# Patient Record
Sex: Male | Born: 1995 | Race: White | Hispanic: No | Marital: Single | Smoking: Current every day smoker
Health system: Southern US, Community
[De-identification: ages and names within clinical notes are randomized; demographics above are authoritative.]

## PROBLEM LIST (undated history)

## (undated) DIAGNOSIS — F32A Depression, unspecified: Secondary | ICD-10-CM

## (undated) DIAGNOSIS — F419 Anxiety disorder, unspecified: Secondary | ICD-10-CM

## (undated) DIAGNOSIS — F329 Major depressive disorder, single episode, unspecified: Secondary | ICD-10-CM

---
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
## 2015-01-16 ENCOUNTER — Observation Stay
Admission: EM | Admit: 2015-01-16 | Discharge: 2015-01-17 | Disposition: A | Discharge status: Home / Self Care | Payer: Managed Care, Other (non HMO) | Attending: Internal Medicine | Admitting: Internal Medicine

## 2015-01-16 ENCOUNTER — Emergency Department: Payer: Managed Care, Other (non HMO)

## 2015-01-16 ENCOUNTER — Other Ambulatory Visit: Payer: Self-pay

## 2015-01-16 DIAGNOSIS — I499 Cardiac arrhythmia, unspecified: Secondary | ICD-10-CM

## 2015-01-16 DIAGNOSIS — F329 Major depressive disorder, single episode, unspecified: Secondary | ICD-10-CM | POA: Insufficient documentation

## 2015-01-16 DIAGNOSIS — T44905S Adverse effect of unspecified drugs primarily affecting the autonomic nervous system, sequela: Secondary | ICD-10-CM | POA: Diagnosis present

## 2015-01-16 DIAGNOSIS — R55 Syncope and collapse: Secondary | ICD-10-CM | POA: Diagnosis not present

## 2015-01-16 DIAGNOSIS — Z79899 Other long term (current) drug therapy: Secondary | ICD-10-CM | POA: Diagnosis not present

## 2015-01-16 DIAGNOSIS — F17210 Nicotine dependence, cigarettes, uncomplicated: Secondary | ICD-10-CM | POA: Insufficient documentation

## 2015-01-16 DIAGNOSIS — F419 Anxiety disorder, unspecified: Secondary | ICD-10-CM | POA: Diagnosis not present

## 2015-01-16 DIAGNOSIS — R569 Unspecified convulsions: Principal | ICD-10-CM

## 2015-01-16 HISTORY — DX: Anxiety disorder, unspecified: F41.9

## 2015-01-16 HISTORY — DX: Depression, unspecified: F32.A

## 2015-01-16 HISTORY — DX: Major depressive disorder, single episode, unspecified: F32.9

## 2015-01-16 LAB — URINE DRUG SCREEN, QUALITATIVE (ARMC ONLY)
Amphetamines, Ur Screen: NOT DETECTED
BENZODIAZEPINE, UR SCRN: POSITIVE — AB
Barbiturates, Ur Screen: NOT DETECTED
Cannabinoid 50 Ng, Ur ~~LOC~~: NOT DETECTED
Cocaine Metabolite,Ur ~~LOC~~: NOT DETECTED
MDMA (Ecstasy)Ur Screen: NOT DETECTED
Methadone Scn, Ur: NOT DETECTED
OPIATE, UR SCREEN: NOT DETECTED
Phencyclidine (PCP) Ur S: NOT DETECTED
TRICYCLIC, UR SCREEN: NOT DETECTED

## 2015-01-16 LAB — CBC
HCT: 45.2 % (ref 40.0–52.0)
HEMOGLOBIN: 15.2 g/dL (ref 13.0–18.0)
MCH: 30.8 pg (ref 26.0–34.0)
MCHC: 33.5 g/dL (ref 32.0–36.0)
MCV: 91.8 fL (ref 80.0–100.0)
Platelets: 246 10*3/uL (ref 150–440)
RBC: 4.93 MIL/uL (ref 4.40–5.90)
RDW: 14.1 % (ref 11.5–14.5)
WBC: 10.6 10*3/uL (ref 3.8–10.6)

## 2015-01-16 LAB — BASIC METABOLIC PANEL
Anion gap: 15 (ref 5–15)
BUN: 12 mg/dL (ref 6–20)
CO2: 22 mmol/L (ref 22–32)
Calcium: 9.3 mg/dL (ref 8.9–10.3)
Chloride: 103 mmol/L (ref 101–111)
Creatinine, Ser: 1.15 mg/dL (ref 0.61–1.24)
GFR calc non Af Amer: >60 mL/min (ref >60)
Glucose, Bld: 149 mg/dL — ABNORMAL HIGH (ref 65–99)
POTASSIUM: 3.5 mmol/L (ref 3.5–5.1)
SODIUM: 140 mmol/L (ref 135–145)

## 2015-01-16 LAB — FIBRIN DERIVATIVES D-DIMER (ARMC ONLY): FIBRIN DERIVATIVES D-DIMER (ARMC): 227 (ref 0–499)

## 2015-01-16 LAB — SALICYLATE LEVEL

## 2015-01-16 LAB — CK: CK TOTAL: 175 U/L (ref 49–397)

## 2015-01-16 LAB — ETHANOL: Alcohol, Ethyl (B): <5 mg/dL (ref <5)

## 2015-01-16 LAB — TROPONIN I

## 2015-01-16 LAB — ACETAMINOPHEN LEVEL: Acetaminophen (Tylenol), Serum: <10 ug/mL — ABNORMAL LOW (ref 10–30)

## 2015-01-16 MED ORDER — HYDROCODONE-ACETAMINOPHEN 5-325 MG PO TABS
1.0000 | ORAL_TABLET | ORAL | Status: DC | PRN
  Administered 2015-01-16: 1 via ORAL
  Administered 2015-01-16: 2 via ORAL
  Filled 2015-01-16 (×3): qty 1

## 2015-01-16 MED ORDER — MAGNESIUM SULFATE 2 GM/50ML IV SOLN
2.0000 g | Freq: Once | INTRAVENOUS | Status: AC
  Administered 2015-01-16: 2 g via INTRAVENOUS
  Filled 2015-01-16: qty 50

## 2015-01-16 MED ORDER — ONDANSETRON HCL 4 MG/2ML IJ SOLN
4.0000 mg | Freq: Four times a day (QID) | INTRAMUSCULAR | Status: DC | PRN
  Administered 2015-01-17: 4 mg via INTRAVENOUS
  Filled 2015-01-16: qty 2

## 2015-01-16 MED ORDER — SODIUM CHLORIDE 0.9 % IV BOLUS (SEPSIS)
1000.0000 mL | Freq: Once | INTRAVENOUS | Status: AC
  Administered 2015-01-16: 1000 mL via INTRAVENOUS

## 2015-01-16 MED ORDER — LORAZEPAM 2 MG/ML IJ SOLN
1.0000 mg | Freq: Once | INTRAMUSCULAR | Status: AC
  Administered 2015-01-16: 1 mg via INTRAVENOUS

## 2015-01-16 MED ORDER — POTASSIUM CHLORIDE IN NACL 20-0.9 MEQ/L-% IV SOLN
INTRAVENOUS | Status: AC
  Filled 2015-01-16: qty 1000

## 2015-01-16 MED ORDER — ONDANSETRON HCL 4 MG PO TABS
4.0000 mg | ORAL_TABLET | Freq: Four times a day (QID) | ORAL | Status: DC | PRN
  Filled 2015-01-16: qty 1

## 2015-01-16 MED ORDER — LORAZEPAM 2 MG/ML IJ SOLN
INTRAMUSCULAR | Status: AC
  Filled 2015-01-16: qty 1

## 2015-01-16 MED ORDER — ACETAMINOPHEN 325 MG PO TABS
650.0000 mg | ORAL_TABLET | Freq: Four times a day (QID) | ORAL | Status: DC | PRN
  Administered 2015-01-16 – 2015-01-17 (×2): 650 mg via ORAL
  Filled 2015-01-16 (×2): qty 2

## 2015-01-16 MED ORDER — SODIUM CHLORIDE 0.9 % IJ SOLN
3.0000 mL | Freq: Two times a day (BID) | INTRAMUSCULAR | Status: DC
  Administered 2015-01-17: 3 mL via INTRAVENOUS

## 2015-01-16 MED ORDER — ENOXAPARIN SODIUM 40 MG/0.4ML ~~LOC~~ SOLN
40.0000 mg | SUBCUTANEOUS | Status: DC
Start: 2015-01-16 — End: 2015-01-17
  Administered 2015-01-16: 40 mg via SUBCUTANEOUS
  Filled 2015-01-16: qty 0.4

## 2015-01-16 MED ORDER — CLONAZEPAM 0.5 MG PO TABS: 0.5000 mg | ORAL_TABLET | Freq: Three times a day (TID) | ORAL | Status: DC | PRN

## 2015-01-16 MED ORDER — ACETAMINOPHEN 650 MG RE SUPP: 650.0000 mg | Freq: Four times a day (QID) | RECTAL | Status: DC | PRN

## 2015-01-16 MED ORDER — POTASSIUM CHLORIDE IN NACL 20-0.9 MEQ/L-% IV SOLN
INTRAVENOUS | Status: DC
  Administered 2015-01-16 – 2015-01-17 (×3): via INTRAVENOUS
  Filled 2015-01-16 (×5): qty 1000

## 2015-01-16 MED ORDER — LORAZEPAM 2 MG/ML IJ SOLN: 1.0000 mg | INTRAMUSCULAR | Status: DC | PRN
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
## 2015-01-16 NOTE — ED Notes (Signed)
Quale MD at bedside 

## 2015-01-16 NOTE — ED Notes (Signed)
MD at bedside. 

## 2015-01-16 NOTE — ED Notes (Signed)
Patient resting comfortably. Father at bedside. Will remain with patient until taken to room. Denies any concerns or questions at this time.
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
## 2015-01-16 NOTE — ED Notes (Signed)
Pt unable to urinate at this time. Urinal given to pt and instructed to void when able.
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
## 2015-01-16 NOTE — Progress Notes (Addendum)
Pt. admitted to unit, rm 256. Oriented to room, call bell, Ascom phones and staff. Bed in low position. Fall safety plan reviewed, yellow non skid socks in place, bed alarm on and fall contract reviewed, signed and placed on wall. Seizure precautions initiated. Full assessment to Epic, pertinent care plan opened and education provided on seizures. Will continue to monitor.

## 2015-01-17 ENCOUNTER — Observation Stay (HOSPITAL_BASED_OUTPATIENT_CLINIC_OR_DEPARTMENT_OTHER)
Admit: 2015-01-17 | Discharge: 2015-01-17 | Disposition: A | Discharge status: Home / Self Care | Payer: Managed Care, Other (non HMO) | Attending: Internal Medicine | Admitting: Internal Medicine

## 2015-01-17 ENCOUNTER — Observation Stay: Payer: Managed Care, Other (non HMO)

## 2015-01-17 DIAGNOSIS — R569 Unspecified convulsions: Secondary | ICD-10-CM | POA: Diagnosis not present

## 2015-01-17 DIAGNOSIS — I499 Cardiac arrhythmia, unspecified: Secondary | ICD-10-CM | POA: Diagnosis not present

## 2015-01-17 DIAGNOSIS — R55 Syncope and collapse: Secondary | ICD-10-CM

## 2015-01-17 LAB — COMPREHENSIVE METABOLIC PANEL
ALBUMIN: 3.9 g/dL (ref 3.5–5.0)
ALT: 17 U/L (ref 17–63)
AST: 24 U/L (ref 15–41)
Alkaline Phosphatase: 54 U/L (ref 38–126)
Anion gap: 4 — ABNORMAL LOW (ref 5–15)
BILIRUBIN TOTAL: 1.2 mg/dL (ref 0.3–1.2)
BUN: 7 mg/dL (ref 6–20)
CALCIUM: 8.6 mg/dL — AB (ref 8.9–10.3)
CO2: 29 mmol/L (ref 22–32)
CREATININE: 0.86 mg/dL (ref 0.61–1.24)
Chloride: 107 mmol/L (ref 101–111)
GFR calc Af Amer: >60 mL/min (ref >60)
GFR calc non Af Amer: >60 mL/min (ref >60)
GLUCOSE: 95 mg/dL (ref 65–99)
Potassium: 3.8 mmol/L (ref 3.5–5.1)
SODIUM: 140 mmol/L (ref 135–145)
TOTAL PROTEIN: 5.7 g/dL — AB (ref 6.5–8.1)

## 2015-01-17 LAB — CBC
HCT: 39.7 % — ABNORMAL LOW (ref 40.0–52.0)
Hemoglobin: 13.4 g/dL (ref 13.0–18.0)
MCH: 30.8 pg (ref 26.0–34.0)
MCHC: 33.8 g/dL (ref 32.0–36.0)
MCV: 91.3 fL (ref 80.0–100.0)
Platelets: 220 10*3/uL (ref 150–440)
RBC: 4.35 MIL/uL — ABNORMAL LOW (ref 4.40–5.90)
RDW: 14.2 % (ref 11.5–14.5)
WBC: 10.7 10*3/uL — AB (ref 3.8–10.6)

## 2015-01-17 MED ORDER — GADOBENATE DIMEGLUMINE 529 MG/ML IV SOLN
15.0000 mL | Freq: Once | INTRAVENOUS | Status: AC | PRN
  Administered 2015-01-17: 13 mL via INTRAVENOUS
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
## 2015-01-17 NOTE — Progress Notes (Signed)
Pt. Given discharge instructions verbalizes understanding with teach back. IV access removed telemetry box removed. Discharge with father.

## 2015-01-17 NOTE — Progress Notes (Signed)
*  PRELIMINARY RESULTS* Echocardiogram 2D Echocardiogram has been performed.  Georgann HousekeeperJerry R Hege 01/17/2015, 12:28 PM

## 2015-01-17 NOTE — Progress Notes (Signed)
Nurse notified of weight difference
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
## 2017-01-03 ENCOUNTER — Emergency Department
Admission: EM | Admit: 2017-01-03 | Discharge: 2017-01-03 | Disposition: A | Discharge status: Home / Self Care | Payer: Commercial Managed Care - PPO | Attending: Emergency Medicine | Admitting: Emergency Medicine

## 2017-01-03 ENCOUNTER — Emergency Department: Payer: Commercial Managed Care - PPO

## 2017-01-03 ENCOUNTER — Encounter: Payer: Self-pay | Admitting: Emergency Medicine

## 2017-01-03 DIAGNOSIS — Y929 Unspecified place or not applicable: Secondary | ICD-10-CM | POA: Insufficient documentation

## 2017-01-03 DIAGNOSIS — F17200 Nicotine dependence, unspecified, uncomplicated: Secondary | ICD-10-CM | POA: Diagnosis not present

## 2017-01-03 DIAGNOSIS — R51 Headache: Secondary | ICD-10-CM | POA: Insufficient documentation

## 2017-01-03 DIAGNOSIS — Y939 Activity, unspecified: Secondary | ICD-10-CM | POA: Diagnosis not present

## 2017-01-03 DIAGNOSIS — Z79899 Other long term (current) drug therapy: Secondary | ICD-10-CM | POA: Diagnosis not present

## 2017-01-03 DIAGNOSIS — R0789 Other chest pain: Secondary | ICD-10-CM | POA: Insufficient documentation

## 2017-01-03 DIAGNOSIS — Y999 Unspecified external cause status: Secondary | ICD-10-CM | POA: Diagnosis not present

## 2017-01-03 MED ORDER — KETOROLAC TROMETHAMINE 30 MG/ML IJ SOLN
30.0000 mg | Freq: Once | INTRAMUSCULAR | Status: AC
  Administered 2017-01-03: 30 mg via INTRAMUSCULAR
  Filled 2017-01-03: qty 1

## 2017-01-03 MED ORDER — CYCLOBENZAPRINE HCL 5 MG PO TABS: 5.0000 mg | ORAL_TABLET | Freq: Three times a day (TID) | ORAL | 0 refills | Status: AC | PRN

## 2017-01-03 MED ORDER — MELOXICAM 7.5 MG PO TABS: 7.5000 mg | ORAL_TABLET | Freq: Every day | ORAL | 1 refills | Status: AC
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
## 2017-06-12 ENCOUNTER — Emergency Department
Admission: EM | Admit: 2017-06-12 | Discharge: 2017-06-12 | Discharge status: Against Medical Advice | Payer: No Typology Code available for payment source

## 2017-06-12 NOTE — ED Notes (Signed)
Pt called twice for triage with no answer 

## 2017-06-26 ENCOUNTER — Emergency Department: Payer: Commercial Managed Care - PPO

## 2017-06-26 ENCOUNTER — Emergency Department
Admission: EM | Admit: 2017-06-26 | Discharge: 2017-06-26 | Discharge status: Against Medical Advice | Payer: Commercial Managed Care - PPO | Attending: Emergency Medicine | Admitting: Emergency Medicine

## 2017-06-26 ENCOUNTER — Encounter: Payer: Self-pay | Admitting: Emergency Medicine

## 2017-06-26 ENCOUNTER — Other Ambulatory Visit: Payer: Self-pay

## 2017-06-26 DIAGNOSIS — J069 Acute upper respiratory infection, unspecified: Secondary | ICD-10-CM | POA: Insufficient documentation

## 2017-06-26 DIAGNOSIS — R0789 Other chest pain: Secondary | ICD-10-CM | POA: Insufficient documentation

## 2017-06-26 DIAGNOSIS — Z79899 Other long term (current) drug therapy: Secondary | ICD-10-CM | POA: Insufficient documentation

## 2017-06-26 DIAGNOSIS — F17210 Nicotine dependence, cigarettes, uncomplicated: Secondary | ICD-10-CM | POA: Diagnosis not present

## 2017-06-26 DIAGNOSIS — R05 Cough: Secondary | ICD-10-CM | POA: Diagnosis present

## 2017-06-26 LAB — CBC WITH DIFFERENTIAL/PLATELET
BASOS PCT: 1 %
Basophils Absolute: 0.1 10*3/uL (ref 0–0.1)
Eosinophils Absolute: 0.6 10*3/uL (ref 0–0.7)
Eosinophils Relative: 6 %
HEMATOCRIT: 42.5 % (ref 40.0–52.0)
Hemoglobin: 14.5 g/dL (ref 13.0–18.0)
LYMPHS PCT: 19 %
Lymphs Abs: 1.9 10*3/uL (ref 1.0–3.6)
MCH: 31.1 pg (ref 26.0–34.0)
MCHC: 34.1 g/dL (ref 32.0–36.0)
MCV: 91.1 fL (ref 80.0–100.0)
MONO ABS: 1.6 10*3/uL — AB (ref 0.2–1.0)
MONOS PCT: 16 %
NEUTROS ABS: 6 10*3/uL (ref 1.4–6.5)
Neutrophils Relative %: 58 %
Platelets: 238 10*3/uL (ref 150–440)
RBC: 4.66 MIL/uL (ref 4.40–5.90)
RDW: 12.7 % (ref 11.5–14.5)
WBC: 10.3 10*3/uL (ref 3.8–10.6)

## 2017-06-26 LAB — BASIC METABOLIC PANEL
Anion gap: 8 (ref 5–15)
BUN: 16 mg/dL (ref 6–20)
CALCIUM: 8.8 mg/dL — AB (ref 8.9–10.3)
CO2: 28 mmol/L (ref 22–32)
CREATININE: 0.9 mg/dL (ref 0.61–1.24)
Chloride: 101 mmol/L (ref 101–111)
GFR calc Af Amer: >60 mL/min (ref >60)
GFR calc non Af Amer: >60 mL/min (ref >60)
GLUCOSE: 92 mg/dL (ref 65–99)
Potassium: 3.3 mmol/L — ABNORMAL LOW (ref 3.5–5.1)
Sodium: 137 mmol/L (ref 135–145)

## 2017-06-26 LAB — TROPONIN I
Troponin I: <0.03 ng/mL (ref <0.03)
Troponin I: <0.03 ng/mL (ref <0.03)

## 2017-06-26 LAB — INFLUENZA PANEL BY PCR (TYPE A & B)
Influenza A By PCR: NEGATIVE
Influenza B By PCR: NEGATIVE

## 2017-06-26 MED ORDER — ACETAMINOPHEN 325 MG PO TABS
650.0000 mg | ORAL_TABLET | Freq: Once | ORAL | Status: DC
  Filled 2017-06-26: qty 2

## 2017-06-26 MED ORDER — KETOROLAC TROMETHAMINE 60 MG/2ML IM SOLN
60.0000 mg | Freq: Once | INTRAMUSCULAR | Status: AC
  Administered 2017-06-26: 60 mg via INTRAMUSCULAR
  Filled 2017-06-26: qty 2
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
## 2017-09-27 ENCOUNTER — Emergency Department
Admission: EM | Admit: 2017-09-27 | Discharge: 2017-09-27 | Disposition: A | Discharge status: Home / Self Care | Payer: Commercial Managed Care - PPO | Attending: Emergency Medicine | Admitting: Emergency Medicine

## 2017-09-27 ENCOUNTER — Other Ambulatory Visit: Payer: Self-pay

## 2017-09-27 ENCOUNTER — Encounter: Payer: Self-pay | Admitting: Emergency Medicine

## 2017-09-27 DIAGNOSIS — F17210 Nicotine dependence, cigarettes, uncomplicated: Secondary | ICD-10-CM | POA: Insufficient documentation

## 2017-09-27 DIAGNOSIS — Z79899 Other long term (current) drug therapy: Secondary | ICD-10-CM | POA: Insufficient documentation

## 2017-09-27 DIAGNOSIS — J029 Acute pharyngitis, unspecified: Secondary | ICD-10-CM | POA: Diagnosis present

## 2017-09-27 MED ORDER — MELOXICAM 7.5 MG PO TABS
ORAL_TABLET | ORAL | Status: AC
  Filled 2017-09-27: qty 1

## 2017-09-27 MED ORDER — MELOXICAM 15 MG PO TABS: 15.0000 mg | ORAL_TABLET | Freq: Every day | ORAL | 1 refills | Status: AC

## 2017-09-27 MED ORDER — MELOXICAM 7.5 MG PO TABS
15.0000 mg | ORAL_TABLET | Freq: Every day | ORAL | Status: DC
  Administered 2017-09-27: 15 mg via ORAL

## 2017-09-27 NOTE — ED Notes (Signed)
Pt states that he "doesn'r really want anything for his throat"..the patient just "wants my DC papers for my lawyer".

## 2017-09-27 NOTE — ED Triage Notes (Signed)
Pt arrives from restaurant where he states that he reports that he was eating banana pudding and "felt something sharp go down his throat". Pt reports that he swallowed some blood after he "hocked up" a skewer. Pt reports that the pain in his throat is getting worse and that his "lawyer told me to get my DC papers". Pt is in NAD.

## 2017-09-27 NOTE — ED Provider Notes (Signed)
[facility] Emergency Department Provider Note  ____________________________________________  Time seen: Approximately 11:08 PM  I have reviewed the triage vital signs and the nursing notes.   HISTORY  Chief Complaint Sore Throat    HPI Theodore Ward is a 22 y.o. male presents to the emergency department after patient reports that he choked on a 1 inch wooden skewer while eating at [employer].  Patient reports that he was enjoying his anniversary with his significant other, when he encountered wooden skewer and banana pudding.  Patient reports that he successfully coughed up a wooden skewer.  Patient reports that he was spitting blood for several minutes after the choking episode.  Patient reports that bleeding resolved and has since stopped.  Patient reports that he is experiencing pharyngitis but no difficulty managing his own secretions.  He is speaking in complete sentences.  Patient reports that he was told to come to the emergency department by his attorney as he intends to take legal action against [employer] because they refunded $26 instead of $36.   Past Medical History:  Diagnosis Date  . Anxiety   . Depression     Patient Active Problem List   Diagnosis Date Noted  . Seizure (HCC) 01/16/2015  . Cardiac arrhythmia 01/16/2015  . Arrhythmia     History reviewed. No pertinent surgical history.  Prior to Admission medications   Medication Sig Start Date End Date Taking? Authorizing Provider  clonazePAM (KLONOPIN) 0.5 MG tablet Take 0.5 mg by mouth 3 (three) times daily as needed for anxiety.    [provider]  escitalopram (LEXAPRO) 10 MG tablet Take 10 mg by mouth daily. 01/12/15   [provider]  meloxicam (MOBIC) 15 MG tablet Take 1 tablet (15 mg total) by mouth daily for 7 days. 09/27/17 10/04/17  Orvil Feil, PA-C    Allergies Patient has no known allergies.  Family History  Problem Relation Age of Onset  .  Migraines Mother   . Seizures Maternal Aunt     Social History Social History   Tobacco Use  . Smoking status: Current Every Day Smoker    Types: E-cigarettes  . Smokeless tobacco: Never Used  Substance Use Topics  . Alcohol use: Yes    Alcohol/week: 0.0 oz    Comment: Social   . Drug use: Yes    Types: Marijuana     Review of Systems  Constitutional: No fever/chills Eyes: No visual changes. No discharge ENT: Patient has pharyngitis Cardiovascular: no chest pain. Respiratory: no cough. No SOB. Gastrointestinal: No abdominal pain.  No nausea, no vomiting.  No diarrhea.  No constipation. Musculoskeletal: Negative for musculoskeletal pain. Skin: Negative for rash, abrasions, lacerations, ecchymosis. Neurological: Negative for headaches, focal weakness or numbness.   ____________________________________________   PHYSICAL EXAM:  VITAL SIGNS: ED Triage Vitals  Enc Vitals Group     BP 09/27/17 2207 (!) 133/105     Pulse --      Resp 09/27/17 2207 18     Temp 09/27/17 2207 98.6 F (37 C)     Temp Source 09/27/17 2207 Oral     SpO2 09/27/17 2207 100 %     Weight 09/27/17 2214 160 lb (72.6 kg)     Height 09/27/17 2214 5\' 9"  (1.753 m)     Head Circumference --      Peak Flow --      Pain Score 09/27/17 2214 8     Pain Loc --  Pain Edu? --      Excl. in GC? --      Constitutional: Alert and oriented. Well appearing and in no acute distress. Eyes: Conjunctivae are normal. PERRL. EOMI. Head: Atraumatic. ENT:      Ears: TMs are pearly.      Nose: No congestion/rhinnorhea.      Mouth/Throat: Mucous membranes are moist.  No abrasions are visualized along the posterior pharynx. Neck: No stridor. No cervical spine tenderness to palpation.  Cardiovascular: Normal rate, regular rhythm. Normal S1 and S2.  Good peripheral circulation. Respiratory: Normal respiratory effort without tachypnea or retractions. Lungs CTAB. Good air entry to the bases with no decreased or  absent breath sounds. Neurologic:  Normal speech and language.   Skin:  Skin is warm, dry and intact.    ____________________________________________   LABS (all labs ordered are listed, but only abnormal results are displayed)  Labs Reviewed - No data to display ____________________________________________  EKG   ____________________________________________  RADIOLOGY   No results found.  ____________________________________________    PROCEDURES  Procedure(s) performed:    Procedures    Medications  meloxicam (MOBIC) tablet 15 mg (has no administration in time range)     ____________________________________________   INITIAL IMPRESSION / ASSESSMENT AND PLAN / ED COURSE  Pertinent labs & imaging results that were available during my care of the patient were reviewed by me and considered in my medical decision making (see chart for details).  Review of the [place] CSRS was performed in accordance of the [facility] prior to dispensing any controlled drugs.     Assessment and plan Pharyngitis Patient presents to the emergency department with pharyngitis after patient choked on a wooden skewer at [employer].  Overall physical exam was reassuring.  Patient reports that he is no longer spitting up blood.  No acute intervention is needed at this time.  Strict return precautions were given to return to the emergency department for new or worsening symptoms.  Patient voiced understanding.  Patient was discharged with meloxicam for pain.  All patient questions were answered.   ____________________________________________  FINAL CLINICAL IMPRESSION(S) / ED DIAGNOSES  Final diagnoses:  Pharyngitis, unspecified etiology      NEW MEDICATIONS STARTED DURING THIS VISIT:  ED Discharge Orders        Ordered    meloxicam (MOBIC) 15 MG tablet  Daily     09/27/17 2300          This chart was dictated using voice recognition software/Dragon. Despite best efforts to  proofread, errors can occur which can change the meaning. Any change was purely unintentional.    Orvil FeilWoods, Jaclyn M, PA-C 09/27/17 2313    Don PerkingVeronese, [place], MD 09/28/17 0005

## 2019-01-25 ENCOUNTER — Other Ambulatory Visit: Payer: Self-pay

## 2019-01-25 ENCOUNTER — Encounter: Payer: Self-pay | Admitting: Emergency Medicine

## 2019-01-25 ENCOUNTER — Emergency Department
Admission: EM | Admit: 2019-01-25 | Discharge: 2019-01-25 | Disposition: A | Discharge status: Home / Self Care | Payer: Commercial Managed Care - PPO | Attending: Emergency Medicine | Admitting: Emergency Medicine

## 2019-01-25 DIAGNOSIS — Z79899 Other long term (current) drug therapy: Secondary | ICD-10-CM | POA: Insufficient documentation

## 2019-01-25 DIAGNOSIS — R21 Rash and other nonspecific skin eruption: Secondary | ICD-10-CM | POA: Diagnosis present

## 2019-01-25 DIAGNOSIS — S80862A Insect bite (nonvenomous), left lower leg, initial encounter: Secondary | ICD-10-CM | POA: Insufficient documentation

## 2019-01-25 DIAGNOSIS — Y9289 Other specified places as the place of occurrence of the external cause: Secondary | ICD-10-CM | POA: Diagnosis not present

## 2019-01-25 DIAGNOSIS — F17290 Nicotine dependence, other tobacco product, uncomplicated: Secondary | ICD-10-CM | POA: Diagnosis not present

## 2019-01-25 DIAGNOSIS — Y93H9 Activity, other involving exterior property and land maintenance, building and construction: Secondary | ICD-10-CM | POA: Diagnosis not present

## 2019-01-25 DIAGNOSIS — Y999 Unspecified external cause status: Secondary | ICD-10-CM | POA: Insufficient documentation

## 2019-01-25 DIAGNOSIS — S40861A Insect bite (nonvenomous) of right upper arm, initial encounter: Secondary | ICD-10-CM | POA: Diagnosis not present

## 2019-01-25 DIAGNOSIS — W57XXXA Bitten or stung by nonvenomous insect and other nonvenomous arthropods, initial encounter: Secondary | ICD-10-CM | POA: Diagnosis not present

## 2019-01-25 DIAGNOSIS — S80869A Insect bite (nonvenomous), unspecified lower leg, initial encounter: Secondary | ICD-10-CM

## 2019-01-25 MED ORDER — DEXAMETHASONE SODIUM PHOSPHATE 10 MG/ML IJ SOLN
10.0000 mg | Freq: Once | INTRAMUSCULAR | Status: AC
  Administered 2019-01-25: 10 mg via INTRAMUSCULAR
  Filled 2019-01-25: qty 1

## 2019-01-25 MED ORDER — PREDNISONE 10 MG PO TABS: 10.0000 mg | ORAL_TABLET | Freq: Every day | ORAL | 0 refills | Status: DC

## 2019-01-25 MED ORDER — SULFAMETHOXAZOLE-TRIMETHOPRIM 800-160 MG PO TABS: 1.0000 | ORAL_TABLET | Freq: Two times a day (BID) | ORAL | 0 refills | Status: DC

## 2019-01-25 MED ORDER — DIPHENHYDRAMINE HCL 25 MG PO CAPS
50.0000 mg | ORAL_CAPSULE | Freq: Once | ORAL | Status: AC
  Administered 2019-01-25: 50 mg via ORAL
  Filled 2019-01-25: qty 2

## 2019-01-25 MED ORDER — SULFAMETHOXAZOLE-TRIMETHOPRIM 800-160 MG PO TABS
1.0000 | ORAL_TABLET | Freq: Once | ORAL | Status: AC
  Administered 2019-01-25: 1 via ORAL
  Filled 2019-01-25: qty 1

## 2019-01-25 NOTE — Discharge Instructions (Addendum)
Please apply hydrocortisone cream daily to insect bites.  You may take Benadryl as needed for itching.  If you begin to develop increasing itching and swelling despite Benadryl you may start steroid taper.  Please take Bactrim as prescribed to prevent infection

## 2019-01-25 NOTE — ED Notes (Signed)

## 2019-01-25 NOTE — ED Provider Notes (Signed)
[facility] EMERGENCY DEPARTMENT Provider Note   CSN: [id number] Arrival date & time: 01/25/19  1618     History   Chief Complaint Chief Complaint  Patient presents with  . Insect Bite    HPI Theodore Ward is a 23 y.o. male.  Presents to the emergency department for evaluation of multiple spider bites that occurred approximately 1 hour prior to arrival.  Patient states he was working in a well when he got into a spider nest.  He was told these were brown recluse spiders.  He is uncertain.  He states he has whelps on his arms and lower legs.  No chest pain, shortness of breath, difficulty swallowing or wheezing.  No history of anaphylaxis to insect or spider bites.  He has not take any medication.  He is having mild swelling and itching along the rash sites.     HPI  Past Medical History:  Diagnosis Date  . Anxiety   . Depression     Patient Active Problem List   Diagnosis Date Noted  . Seizure (HCC) 01/16/2015  . Cardiac arrhythmia 01/16/2015  . Arrhythmia     History reviewed. No pertinent surgical history.      Home Medications    Prior to Admission medications   Medication Sig Start Date End Date Taking? Authorizing Provider  clonazePAM (KLONOPIN) 0.5 MG tablet Take 0.5 mg by mouth 3 (three) times daily as needed for anxiety.    [provider]  escitalopram (LEXAPRO) 10 MG tablet Take 10 mg by mouth daily. 01/12/15   [provider]    Family History Family History  Problem Relation Age of Onset  . Migraines Mother   . Seizures Maternal Aunt     Social History Social History   Tobacco Use  . Smoking status: Current Every Day Smoker    Types: E-cigarettes  . Smokeless tobacco: Never Used  Substance Use Topics  . Alcohol use: Yes    Alcohol/week: 0.0 standard drinks    Comment: Social   . Drug use: Yes    Types: Marijuana     Allergies   Patient has no known allergies.   Review of Systems Review of  Systems  Constitutional: Negative for fever.  HENT: Negative for trouble swallowing.   Respiratory: Negative for cough.   Cardiovascular: Negative for chest pain, palpitations and leg swelling.  Genitourinary: Negative for flank pain.  Musculoskeletal: Negative for arthralgias and back pain.  Skin: Positive for rash.     Physical Exam Updated Vital Signs BP (!) 141/73 (BP Location: Left Arm)   Pulse 77   Temp 98.4 F (36.9 C) (Oral)   Resp 16   Ht 5\' 9"  (1.753 m)   Wt 72.6 kg   SpO2 99%   BMI 23.63 kg/m   Physical Exam Constitutional:      Appearance: He is well-developed.  HENT:     Head: Normocephalic and atraumatic.  Eyes:     Conjunctiva/sclera: Conjunctivae normal.  Neck:     Musculoskeletal: Normal range of motion.  Cardiovascular:     Rate and Rhythm: Normal rate.  Pulmonary:     Effort: Pulmonary effort is normal. No respiratory distress.  Musculoskeletal: Normal range of motion.        General: No swelling or deformity.  Skin:    General: Skin is warm.     Findings: No rash.     Comments: Multiple maculopapular areas of erythema along the upper and lower extremities mostly  right arm left leg.  Areas are erythematous, pruritic, nonfluctuant with no warmth.  There is no current no necrotic tissue.  Compartments are soft.  No significant extremity swelling.  Neurological:     Mental Status: He is alert and oriented to person, place, and time.  Psychiatric:        Behavior: Behavior normal.        Thought Content: Thought content normal.      ED Treatments / Results  Labs (all labs ordered are listed, but only abnormal results are displayed) Labs Reviewed - No data to display  EKG None  Radiology No results found.  Procedures Procedures (including critical care time)  Medications Ordered in ED Medications  diphenhydrAMINE (BENADRYL) capsule 50 mg (has no administration in time range)  dexamethasone (DECADRON) injection 10 mg (has no  administration in time range)     Initial Impression / Assessment and Plan / ED Course  I have reviewed the triage vital signs and the nursing notes.  Pertinent labs & imaging results that were available during my care of the patient were reviewed by me and considered in my medical decision making (see chart for details).        23 year old male with insect bites to the upper and lower extremities.  Was having significant localized inflammatory response around the bites but after dexamethasone and Benadryl patient much better with very minimal itching and significant reduction and localized soft tissue swelling.  No signs of compartment syndrome.  No signs of anaphylaxis.  Vital signs are stable with no difficulty swallowing or breathing.  Patient is sent home with prescriptions for prednisone, Bactrim.  He will try to use topical cortisone cream and oral Benadryl but if any increase in itching and localized swelling despite these medications he can start steroid taper.  He understands signs and symptoms return to the ED for.  He is also given prophylactic antibiotic, Bactrim   Final Clinical Impressions(s) / ED Diagnoses   Final diagnoses:  Insect bite of lower leg, unspecified laterality, initial encounter  Insect bite of right upper extremity, initial encounter    ED Discharge Orders    None       Renata Caprice 01/25/19 1848    Nena Polio, MD 01/25/19 [phone]

## 2019-01-25 NOTE — ED Triage Notes (Signed)
Pt to ED via POV c/o spider bites. Pt states that he was at work and fell into a well. Pt states that the person who owned the well told him that there were [employer] spiders in there. Pt has several bites, pt is having some swelling to the areas. Pt denies shortness of breath. Pt is in NDA.

## 2019-08-29 ENCOUNTER — Emergency Department
Admission: EM | Admit: 2019-08-29 | Discharge: 2019-08-29 | Disposition: A | Discharge status: Home / Self Care | Payer: Commercial Managed Care - PPO | Attending: Student in an Organized Health Care Education/Training Program | Admitting: Student in an Organized Health Care Education/Training Program

## 2019-08-29 ENCOUNTER — Emergency Department: Payer: Commercial Managed Care - PPO

## 2019-08-29 ENCOUNTER — Other Ambulatory Visit: Payer: Self-pay

## 2019-08-29 ENCOUNTER — Encounter: Payer: Self-pay | Admitting: Emergency Medicine

## 2019-08-29 DIAGNOSIS — R1013 Epigastric pain: Secondary | ICD-10-CM

## 2019-08-29 DIAGNOSIS — Z79899 Other long term (current) drug therapy: Secondary | ICD-10-CM | POA: Insufficient documentation

## 2019-08-29 DIAGNOSIS — F17290 Nicotine dependence, other tobacco product, uncomplicated: Secondary | ICD-10-CM | POA: Diagnosis not present

## 2019-08-29 DIAGNOSIS — K920 Hematemesis: Secondary | ICD-10-CM | POA: Diagnosis present

## 2019-08-29 LAB — COMPREHENSIVE METABOLIC PANEL
ALT: 34 U/L (ref 0–44)
AST: 30 U/L (ref 15–41)
Albumin: 4.4 g/dL (ref 3.5–5.0)
Alkaline Phosphatase: 58 U/L (ref 38–126)
Anion gap: 10 (ref 5–15)
BUN: 12 mg/dL (ref 6–20)
CO2: 25 mmol/L (ref 22–32)
Calcium: 9.2 mg/dL (ref 8.9–10.3)
Chloride: 104 mmol/L (ref 98–111)
Creatinine, Ser: 1 mg/dL (ref 0.61–1.24)
GFR calc Af Amer: >60 mL/min (ref >60)
GFR calc non Af Amer: >60 mL/min (ref >60)
Glucose, Bld: 103 mg/dL — ABNORMAL HIGH (ref 70–99)
Potassium: 3.8 mmol/L (ref 3.5–5.1)
Sodium: 139 mmol/L (ref 135–145)
Total Bilirubin: 0.6 mg/dL (ref 0.3–1.2)
Total Protein: 7.4 g/dL (ref 6.5–8.1)

## 2019-08-29 LAB — CBC
HCT: 43 % (ref 39.0–52.0)
Hemoglobin: 14.9 g/dL (ref 13.0–17.0)
MCH: 30.7 pg (ref 26.0–34.0)
MCHC: 34.7 g/dL (ref 30.0–36.0)
MCV: 88.5 fL (ref 80.0–100.0)
Platelets: 249 10*3/uL (ref 150–400)
RBC: 4.86 MIL/uL (ref 4.22–5.81)
RDW: 12.2 % (ref 11.5–15.5)
WBC: 9.3 10*3/uL (ref 4.0–10.5)
nRBC: 0 % (ref 0.0–0.2)

## 2019-08-29 LAB — TYPE AND SCREEN
ABO/RH(D): A POS
Antibody Screen: NEGATIVE

## 2019-08-29 LAB — LIPASE, BLOOD: Lipase: 33 U/L (ref 11–51)

## 2019-08-29 MED ORDER — LIDOCAINE VISCOUS HCL 2 % MT SOLN
15.0000 mL | Freq: Once | OROMUCOSAL | Status: AC
  Administered 2019-08-29: 11:00:00 15 mL via ORAL
  Filled 2019-08-29: qty 15

## 2019-08-29 MED ORDER — IOHEXOL 300 MG/ML  SOLN
100.0000 mL | Freq: Once | INTRAMUSCULAR | Status: AC | PRN
  Administered 2019-08-29: 13:00:00 100 mL via INTRAVENOUS

## 2019-08-29 MED ORDER — PANTOPRAZOLE SODIUM 40 MG PO TBEC: 40.0000 mg | DELAYED_RELEASE_TABLET | Freq: Every day | ORAL | 0 refills | Status: DC

## 2019-08-29 MED ORDER — PANTOPRAZOLE SODIUM 40 MG PO TBEC: 40.0000 mg | DELAYED_RELEASE_TABLET | Freq: Every day | ORAL | 1 refills | Status: DC

## 2019-08-29 MED ORDER — ONDANSETRON 4 MG PO TBDP
4.0000 mg | ORAL_TABLET | Freq: Once | ORAL | Status: AC
  Administered 2019-08-29: 11:00:00 4 mg via ORAL
  Filled 2019-08-29: qty 1

## 2019-08-29 MED ORDER — PANTOPRAZOLE SODIUM 40 MG PO TBEC
40.0000 mg | DELAYED_RELEASE_TABLET | Freq: Once | ORAL | Status: AC
  Administered 2019-08-29: 11:00:00 40 mg via ORAL
  Filled 2019-08-29: qty 1

## 2019-08-29 MED ORDER — SODIUM CHLORIDE 0.9% FLUSH: 3.0000 mL | Freq: Once | INTRAVENOUS | Status: DC

## 2019-08-29 MED ORDER — ALUM & MAG HYDROXIDE-SIMETH 200-200-20 MG/5ML PO SUSP
30.0000 mL | Freq: Once | ORAL | Status: AC
  Administered 2019-08-29: 11:00:00 30 mL via ORAL
  Filled 2019-08-29: qty 30

## 2019-08-29 MED ORDER — SUCRALFATE 1 G PO TABS: 1.0000 g | ORAL_TABLET | Freq: Three times a day (TID) | ORAL | 0 refills | Status: DC | PRN

## 2019-08-29 NOTE — ED Triage Notes (Signed)
Pt to ED via POV c/o abdominal pain and hematemesis. Pt states that he drink some beers early this morning. Pt states that he also had taken some ibuprofen and BC powders last night. Pt states that he started vomiting blood this morning and having stabbing pain in his abdomen. PT is inf NAD at this time.

## 2019-08-29 NOTE — ED Provider Notes (Signed)
[facility] Emergency Department Provider Note    First MD Initiated Contact with Patient 08/29/19 1051     (approximate)  I have reviewed the triage vital signs and the nursing notes.   HISTORY  Chief Complaint Hematemesis    HPI Theodore Ward is a 24 y.o. male below listed past medical history presents to the ER for evaluation of several episodes of streaky blood in his emesis this morning.  Patient states that he works long hours did take Percocet 5 as well as ibuprofen and BC Goody powder.  He got off work around 5 AM this morning and then had several cans of beer as well as a "blue letter ".  Been started feeling outside stomach and had initially clear episode of emesis but then the second 1 had blood in it.  Is not having melena.   Also been undergoing a lot of stress at home and at work.  Denies any history of heartburn or reflux.  No history of liver issues.  Is not a blood thinners.   Past Medical History:  Diagnosis Date  . Anxiety   . Depression    Family History  Problem Relation Age of Onset  . Migraines Mother   . Seizures Maternal Aunt    History reviewed. No pertinent surgical history. Patient Active Problem List   Diagnosis Date Noted  . Seizure ([place]) 01/16/2015  . Cardiac arrhythmia 01/16/2015  . Arrhythmia       Prior to Admission medications   Medication Sig Start Date End Date Taking? Authorizing Provider  clonazePAM (KLONOPIN) 0.5 MG tablet Take 0.5 mg by mouth 3 (three) times daily as needed for anxiety.    [provider]  escitalopram (LEXAPRO) 10 MG tablet Take 10 mg by mouth daily. 01/12/15   [provider]  pantoprazole (PROTONIX) 40 MG tablet Take 1 tablet (40 mg total) by mouth daily. 08/29/19 08/28/20  Merlyn Lot, MD  predniSONE (DELTASONE) 10 MG tablet Take 1 tablet (10 mg total) by mouth daily. 6,5,4,3,2,1 six day taper 01/25/19   Duanne Guess, PA-C  sucralfate (CARAFATE) 1 g tablet  Take 1 tablet (1 g total) by mouth 3 (three) times daily as needed. 08/29/19 08/28/20  Merlyn Lot, MD  sulfamethoxazole-trimethoprim (BACTRIM DS) 800-160 MG tablet Take 1 tablet by mouth 2 (two) times daily. 01/25/19   Duanne Guess, PA-C    Allergies Patient has no known allergies.    Social History Social History   Tobacco Use  . Smoking status: Current Every Day Smoker    Types: E-cigarettes  . Smokeless tobacco: Never Used  Substance Use Topics  . Alcohol use: Yes    Alcohol/week: 0.0 standard drinks    Comment: Social   . Drug use: Yes    Types: Marijuana    Review of Systems Patient denies headaches, rhinorrhea, blurry vision, numbness, shortness of breath, chest pain, edema, cough, abdominal pain, nausea, vomiting, diarrhea, dysuria, fevers, rashes or hallucinations unless otherwise stated above in HPI. ____________________________________________   PHYSICAL EXAM:  VITAL SIGNS: Vitals:   08/29/19 1200 08/29/19 1230  BP: 134/83 126/67  Pulse: 73 69  Resp: (!) 23 19  Temp:    SpO2: 96% 97%    Constitutional: Alert and oriented.  Eyes: Conjunctivae are normal.  Head: Atraumatic. Nose: No congestion/rhinnorhea. Mouth/Throat: Mucous membranes are moist.   Neck: No stridor. Painless ROM.  Cardiovascular: Normal rate, regular rhythm. Grossly normal heart sounds.  Good peripheral circulation. Respiratory: Normal respiratory  effort.  No retractions. Lungs CTAB. Gastrointestinal: Soft and nontender in all four quadrants. No distention. No abdominal bruits. No CVA tenderness. Genitourinary:  Musculoskeletal: No lower extremity tenderness nor edema.  No joint effusions. Neurologic:  Normal speech and language. No gross focal neurologic deficits are appreciated. No facial droop Skin:  Skin is warm, dry and intact. No rash noted. Psychiatric: Mood and affect are normal. Speech and behavior are normal.  ____________________________________________   LABS (all  labs ordered are listed, but only abnormal results are displayed)  Results for orders placed or performed during the hospital encounter of 08/29/19 (from the past 24 hour(s))  Lipase, blood     Status: None   Collection Time: 08/29/19 11:01 AM  Result Value Ref Range   Lipase 33 11 - 51 U/L  Comprehensive metabolic panel     Status: Abnormal   Collection Time: 08/29/19 11:01 AM  Result Value Ref Range   Sodium 139 135 - 145 mmol/L   Potassium 3.8 3.5 - 5.1 mmol/L   Chloride 104 98 - 111 mmol/L   CO2 25 22 - 32 mmol/L   Glucose, Bld 103 (H) 70 - 99 mg/dL   BUN 12 6 - 20 mg/dL   Creatinine, Ser 8.65 0.61 - 1.24 mg/dL   Calcium 9.2 8.9 - 78.4 mg/dL   Total Protein 7.4 6.5 - 8.1 g/dL   Albumin 4.4 3.5 - 5.0 g/dL   AST 30 15 - 41 U/L   ALT 34 0 - 44 U/L   Alkaline Phosphatase 58 38 - 126 U/L   Total Bilirubin 0.6 0.3 - 1.2 mg/dL   GFR calc non Af Amer >60 >60 mL/min   GFR calc Af Amer >60 >60 mL/min   Anion gap 10 5 - 15  CBC     Status: None   Collection Time: 08/29/19 11:01 AM  Result Value Ref Range   WBC 9.3 4.0 - 10.5 K/uL   RBC 4.86 4.22 - 5.81 MIL/uL   Hemoglobin 14.9 13.0 - 17.0 g/dL   HCT 69.6 29.5 - 28.4 %   MCV 88.5 80.0 - 100.0 fL   MCH 30.7 26.0 - 34.0 pg   MCHC 34.7 30.0 - 36.0 g/dL   RDW 13.2 44.0 - 10.2 %   Platelets 249 150 - 400 K/uL   nRBC 0.0 0.0 - 0.2 %  Type and screen [facility]     Status: None   Collection Time: 08/29/19 11:01 AM  Result Value Ref Range   ABO/RH(D) A POS    Antibody Screen NEG    Sample Expiration      09/01/2019,2359 Performed at [facility], [address] [place], [place] [address]    ____________________________________________  _______________________________  RADIOLOGY  I personally reviewed all radiographic images ordered to evaluate for the above acute complaints and reviewed radiology reports and findings.  These findings were personally discussed with the patient.  Please see  medical record for radiology report.  ____________________________________________   PROCEDURES  Procedure(s) performed:  Procedures    Critical Care performed: no ____________________________________________   INITIAL IMPRESSION / ASSESSMENT AND PLAN / ED COURSE  Pertinent labs & imaging results that were available during my care of the patient were reviewed by me and considered in my medical decision making (see chart for details).   DDX: gastritis, mallory-weis tears, enteritis, medication effect, PUD,  Ugib, unlikely boerhaaves  Theodore Ward is a 24 y.o. who presents to the ED with symptoms as described  above.  Patient with reassuring exam.  Blood work is also reassuring.  Was given medication with some persistent epigastric pain.  Given report of bleeding and alcohol use CT imaging ordered to evaluate for early pancreatitis.  Not consistent with Boerhaave's perforation.  Patient tolerating p.o.  At this point stable and appropriate for outpatient follow-up.  Discussed the importance of abstaining from NSAID use as well as alcohol.  Patient has PCP.  Have discussed with the patient and available family all diagnostics and treatments performed thus far and all questions were answered to the best of my ability. The patient demonstrates understanding and agreement with plan.      The patient was evaluated in Emergency Department today for the symptoms described in the history of present illness. He/she was evaluated in the context of the global COVID-19 pandemic, which necessitated consideration that the patient might be at risk for infection with the SARS-CoV-2 virus that causes COVID-19. Institutional protocols and algorithms that pertain to the evaluation of patients at risk for COVID-19 are in a state of rapid change based on information released by regulatory bodies including the CDC and federal and state organizations. These policies and algorithms were followed during the  patient's care in the ED.  As part of my medical decision making, I reviewed the following data within the electronic MEDICAL RECORD NUMBER Nursing notes reviewed and incorporated, Labs reviewed, notes from prior ED visits and [place] Controlled Substance Database   ____________________________________________   FINAL CLINICAL IMPRESSION(S) / ED DIAGNOSES  Final diagnoses:  Epigastric pain      NEW MEDICATIONS STARTED DURING THIS VISIT:  New Prescriptions   PANTOPRAZOLE (PROTONIX) 40 MG TABLET    Take 1 tablet (40 mg total) by mouth daily.   SUCRALFATE (CARAFATE) 1 G TABLET    Take 1 tablet (1 g total) by mouth 3 (three) times daily as needed.     Note:  This document was prepared using Dragon voice recognition software and may include unintentional dictation errors.    Willy Eddy, MD 08/29/19 [phone]

## 2019-08-29 NOTE — Discharge Instructions (Signed)
Please DO NOT take NSAIDS/BC powder.  Avoid alcohol.    You have been seen in the emergency department for emergency care. It is important that you contact your own doctor, specialist or the closest clinic for follow-up care. Please bring this instruction sheet, all medications and X-ray copies with you when you are seen for follow-up care.  Determining the exact cause for all patients with abdominal pain is extremely difficult in the emergency department. Our primary focus is to rule-out immediate life-threatening diseases. If no immediate source of pain is found the definitive diagnosis frequently needs to be determined over time.Many times your primary care physician can determine the cause by following the symptoms over time. Sometimes, specialist are required such as Gastroenterologists, Gynecologists, Urologists or Surgeons. Please return immediately to the Emergency Department for fever>101, Vomiting or Intractable Pain. You should return to the emergency department or see your primary care provider in 12-24hrs if your pain is no better and sooner if your pain becomes worse.

## 2019-11-28 ENCOUNTER — Other Ambulatory Visit: Payer: Self-pay

## 2019-11-28 ENCOUNTER — Emergency Department: Payer: Commercial Managed Care - PPO

## 2019-11-28 ENCOUNTER — Emergency Department
Admission: EM | Admit: 2019-11-28 | Discharge: 2019-11-28 | Disposition: A | Discharge status: Home / Self Care | Payer: Commercial Managed Care - PPO | Attending: Emergency Medicine | Admitting: Emergency Medicine

## 2019-11-28 DIAGNOSIS — F17290 Nicotine dependence, other tobacco product, uncomplicated: Secondary | ICD-10-CM | POA: Diagnosis not present

## 2019-11-28 DIAGNOSIS — Z23 Encounter for immunization: Secondary | ICD-10-CM | POA: Diagnosis not present

## 2019-11-28 DIAGNOSIS — R55 Syncope and collapse: Secondary | ICD-10-CM | POA: Diagnosis not present

## 2019-11-28 DIAGNOSIS — Y9389 Activity, other specified: Secondary | ICD-10-CM | POA: Insufficient documentation

## 2019-11-28 DIAGNOSIS — S0101XA Laceration without foreign body of scalp, initial encounter: Secondary | ICD-10-CM | POA: Diagnosis not present

## 2019-11-28 DIAGNOSIS — Y9259 Other trade areas as the place of occurrence of the external cause: Secondary | ICD-10-CM | POA: Insufficient documentation

## 2019-11-28 DIAGNOSIS — S01412A Laceration without foreign body of left cheek and temporomandibular area, initial encounter: Secondary | ICD-10-CM | POA: Diagnosis not present

## 2019-11-28 DIAGNOSIS — Y999 Unspecified external cause status: Secondary | ICD-10-CM | POA: Insufficient documentation

## 2019-11-28 DIAGNOSIS — S0993XA Unspecified injury of face, initial encounter: Secondary | ICD-10-CM | POA: Diagnosis present

## 2019-11-28 DIAGNOSIS — Z79899 Other long term (current) drug therapy: Secondary | ICD-10-CM | POA: Diagnosis not present

## 2019-11-28 MED ORDER — TETANUS-DIPHTH-ACELL PERTUSSIS 5-2.5-18.5 LF-MCG/0.5 IM SUSP
0.5000 mL | Freq: Once | INTRAMUSCULAR | Status: AC
  Administered 2019-11-28: 0.5 mL via INTRAMUSCULAR
  Filled 2019-11-28: qty 0.5

## 2019-11-28 NOTE — ED Notes (Signed)
Pt declines offer for wheelchair to discharge. Discharge instructions reviewed with girlfriend as well.
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
## 2019-11-28 NOTE — ED Notes (Addendum)
Pt speaking very loudly, complains of mouth pain, asking for po fluids. Pt informed of need to remain NPO until ct scan results are back. Pt appears intoxicated. Moves all extremities without difficulty.

## 2019-11-28 NOTE — ED Notes (Signed)
Pt's girlfriend at bedside.
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
## 2019-11-28 NOTE — ED Notes (Signed)
Pt requesting to leave. md notified, per md pt may be informed that if he chooses to leave she with IVC him to ensure his safety until his ct results are back. Pt requesting pain medication, will notify md. Pt states "this is just a bandaid hospital"

## 2020-01-25 ENCOUNTER — Ambulatory Visit
Admission: EM | Admit: 2020-01-25 | Discharge: 2020-01-25 | Disposition: A | Discharge status: Home / Self Care | Payer: Commercial Managed Care - PPO | Attending: Family Medicine | Admitting: Family Medicine

## 2020-01-25 ENCOUNTER — Encounter: Payer: Self-pay | Admitting: Emergency Medicine

## 2020-01-25 ENCOUNTER — Other Ambulatory Visit: Payer: Self-pay

## 2020-01-25 DIAGNOSIS — Z7689 Persons encountering health services in other specified circumstances: Secondary | ICD-10-CM

## 2020-01-25 NOTE — Discharge Instructions (Signed)
Okay to return to work.  Take care  Dr. Ericka Marcellus  

## 2020-01-25 NOTE — ED Triage Notes (Signed)
Patient injured his right ankle a few days ago and was out of work. He states he needs a note to return to work. Patient denies problems with his ankle now.

## 2020-01-25 NOTE — ED Provider Notes (Signed)
[facility]    CSN: [id number] Arrival date & time: 01/25/20  1545      History   Chief Complaint Chief Complaint  Patient presents with   work note    HPI  24 year old male presents with recent injury to his right knee and requesting evaluation so he can return to work.  Patient reports that 1.5 weeks ago he twisted his right knee.  Patient reports that he rested and iced the area with subsequent improvement.  Patient states that he was out of work for a few days.  Patient reports that he is due to return to work but wanted to be evaluated so that he can get a note to return to work.  He has no pain at complaints or concerns at this time.  Past Medical History:  Diagnosis Date   Anxiety    Depression    Patient Active Problem List   Diagnosis Date Noted   Seizure (HCC) 01/16/2015   Cardiac arrhythmia 01/16/2015   Arrhythmia     Home Medications    Prior to Admission medications   Medication Sig Start Date End Date Taking? Authorizing Provider  pantoprazole (PROTONIX) 40 MG tablet Take 1 tablet (40 mg total) by mouth daily. 08/29/19 01/25/20  Willy Eddy, MD  sucralfate (CARAFATE) 1 g tablet Take 1 tablet (1 g total) by mouth 3 (three) times daily as needed. 08/29/19 01/25/20  Willy Eddy, MD    Family History Family History  Problem Relation Age of Onset   Migraines Mother    Seizures Maternal Aunt     Social History Social History   Tobacco Use   Smoking status: Current Every Day Smoker    Types: E-cigarettes   Smokeless tobacco: Never Used  Building services engineer Use: Every day  Substance Use Topics   Alcohol use: Yes    Alcohol/week: 0.0 standard drinks    Comment: Social    Drug use: Yes    Types: Marijuana     Allergies   Patient has no known allergies.   Review of Systems Review of Systems  Musculoskeletal:       Recent R knee injury.   Physical Exam Triage Vital Signs ED Triage Vitals  Enc Vitals  Group     BP 01/25/20 1559 138/90     Pulse Rate 01/25/20 1559 60     Resp 01/25/20 1559 18     Temp 01/25/20 1559 98.2 F (36.8 C)     Temp Source 01/25/20 1559 Oral     SpO2 01/25/20 1559 97 %     Weight 01/25/20 1557 180 lb (81.6 kg)     Height 01/25/20 1557 5\' 9"  (1.753 m)     Head Circumference --      Peak Flow --      Pain Score 01/25/20 1557 0     Pain Loc --      Pain Edu? --      Excl. in GC? --    No data found.  Updated Vital Signs BP 138/90 (BP Location: Left Arm)    Pulse 60    Temp 98.2 F (36.8 C) (Oral)    Resp 18    Ht 5\' 9"  (1.753 m)    Wt 81.6 kg    SpO2 97%    BMI 26.58 kg/m   Visual Acuity Right Eye Distance:   Left Eye Distance:   Bilateral Distance:    Right Eye Near:   Left Eye Near:  Bilateral Near:     Physical Exam Vitals and nursing note reviewed.  Constitutional:      General: He is not in acute distress.    Appearance: Normal appearance. He is not ill-appearing.  HENT:     Head: Normocephalic.  Pulmonary:     Effort: Pulmonary effort is normal. No respiratory distress.  Musculoskeletal:     Comments: Right knee -no swelling or effusion. No areas of tenderness. Normal exam of the right knee.  Neurological:     Mental Status: He is alert.  Psychiatric:        Mood and Affect: Mood normal.        Behavior: Behavior normal.      UC Treatments / Results  Labs (all labs ordered are listed, but only abnormal results are displayed) Labs Reviewed - No data to display  EKG   Radiology No results found.  Procedures Procedures (including critical care time)  Medications Ordered in UC Medications - No data to display  Initial Impression / Assessment and Plan / UC Course  I have reviewed the triage vital signs and the nursing notes.  Pertinent labs & imaging results that were available during my care of the patient were reviewed by me and considered in my medical decision making (see chart for details).    24 year old male  presents for return to work evaluation.  Recently injured his knee.  Exam unremarkable.  Cleared to go back to work.  Final Clinical Impressions(s) / UC Diagnoses   Final diagnoses:  Return to work evaluation     Discharge Instructions     Okay to return to work.  Take care  Dr. Adriana Simas    ED Prescriptions    None     PDMP not reviewed this encounter.   Tommie Sams, [place] 01/26/20 2110

## 2020-06-24 IMAGING — CT CT ABDOMEN W/ CM
2 of 4 series · 16 of 46 positions shown, 18 images · IV contrast (APPLIED)
Comparison: None.

CLINICAL DATA: 23-year-old male with nausea and vomiting. Suspected
acute pancreatitis.

EXAM:
CT ABDOMEN WITH CONTRAST
TECHNIQUE: Multidetector CT imaging of the abdomen was performed using the
standard protocol following bolus administration of intravenous
contrast.
CONTRAST:  100mL OMNIPAQUE IOHEXOL 300 MG/ML  SOLN

[Series 2: routine abd/pel with · axial · 0.79mm/px · z∈[-257,+28]mm · 13 of 65 slices shown, 15 images]
[im 4/65  soft-tissue]
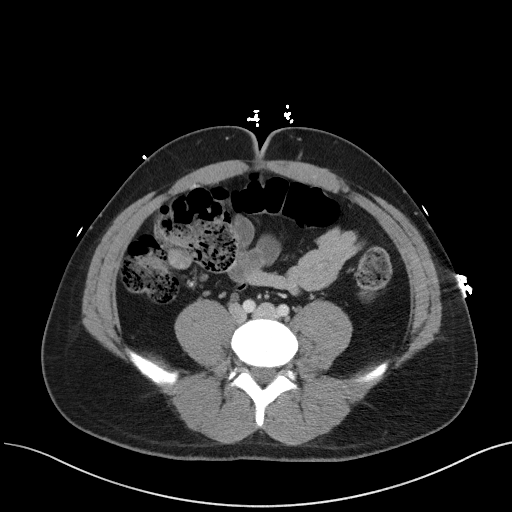
[im 4/65  bone]
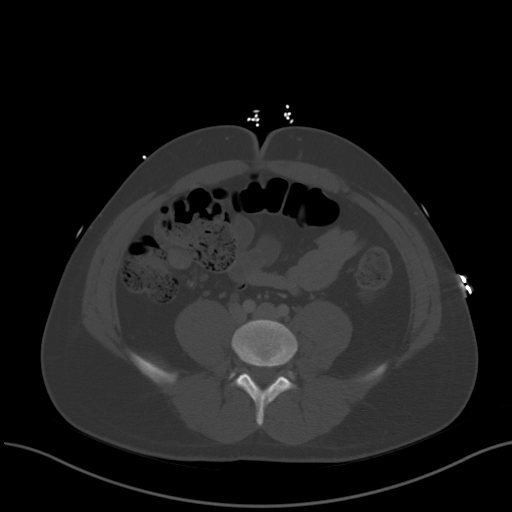
[im 10/65  soft-tissue]
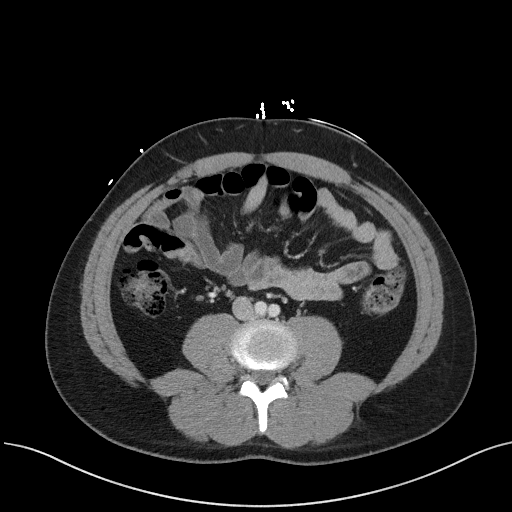
[im 13/65  soft-tissue]
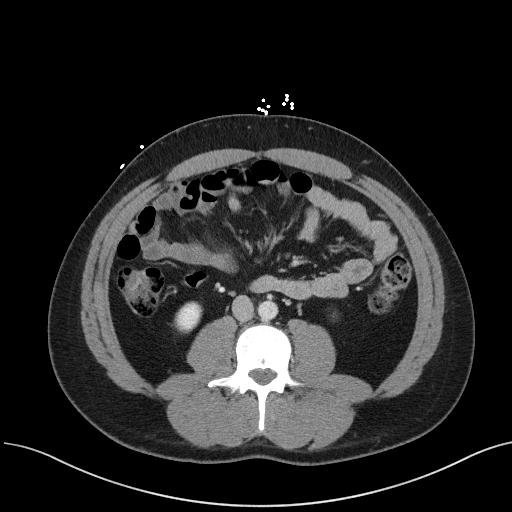
[im 20/65  soft-tissue]
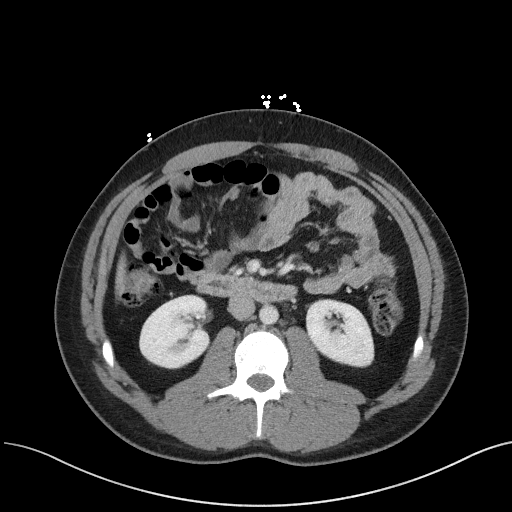
[im 23/65  soft-tissue]
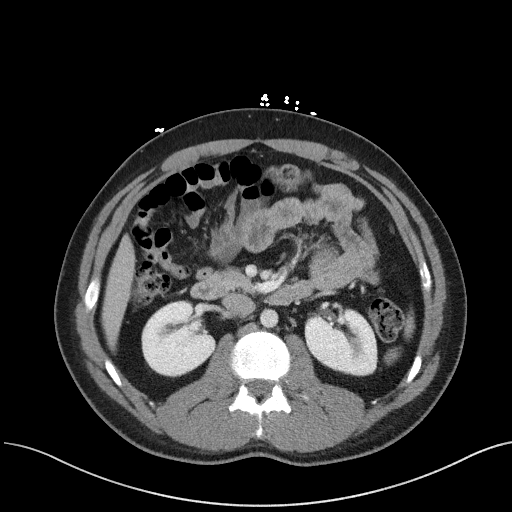
[im 29/65  soft-tissue]
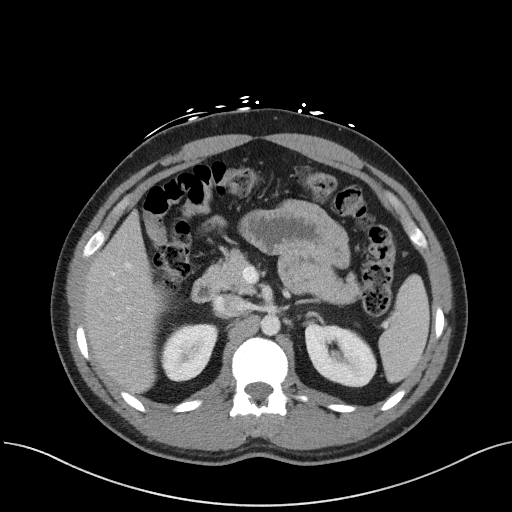
[im 33/65  soft-tissue]
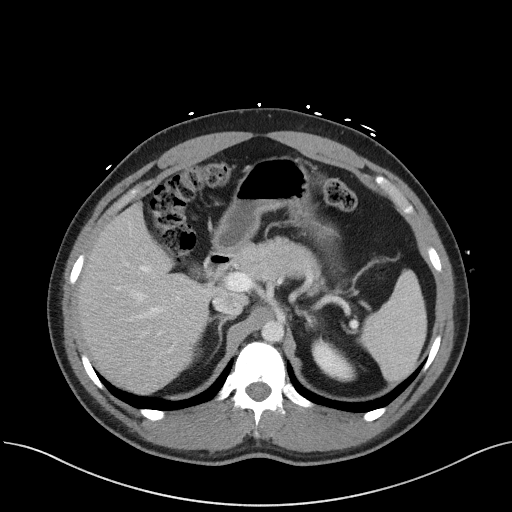
[im 36/65  soft-tissue]
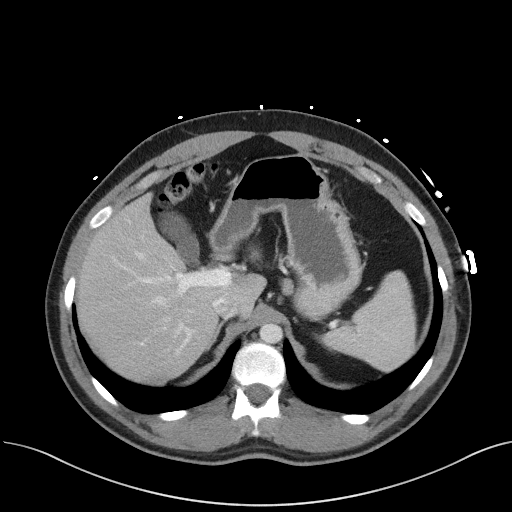
[im 42/65  soft-tissue]
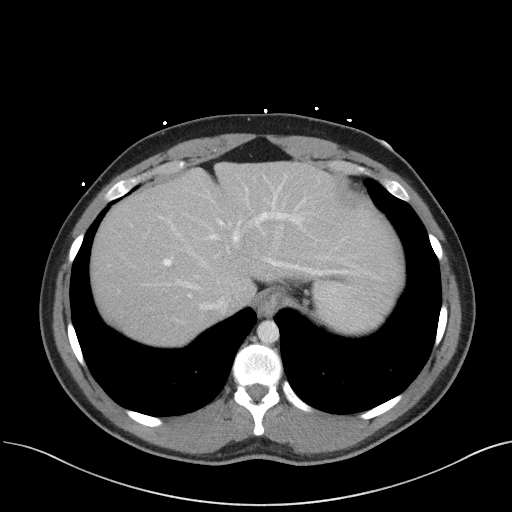
[im 42/65  bone]
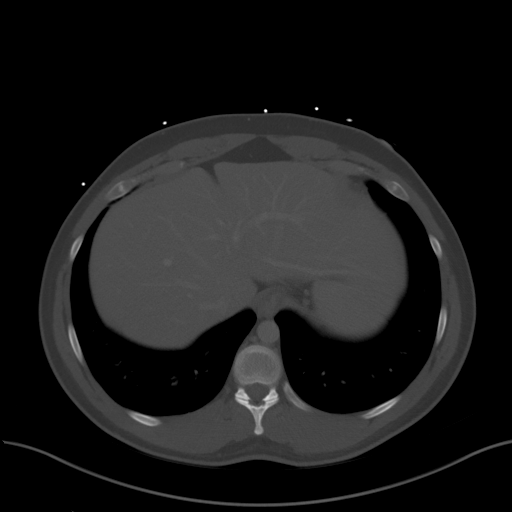
[im 45/65  soft-tissue]
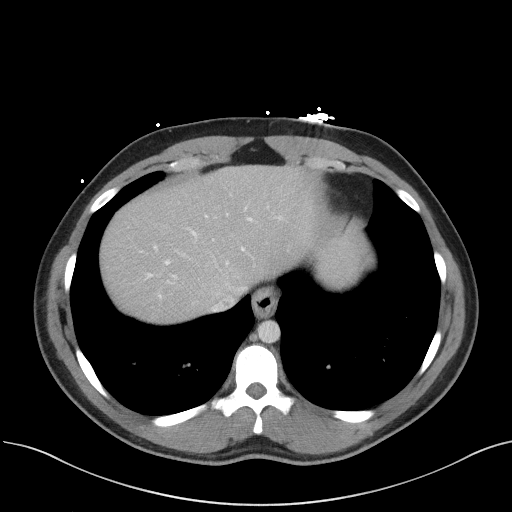
[im 52/65  soft-tissue]
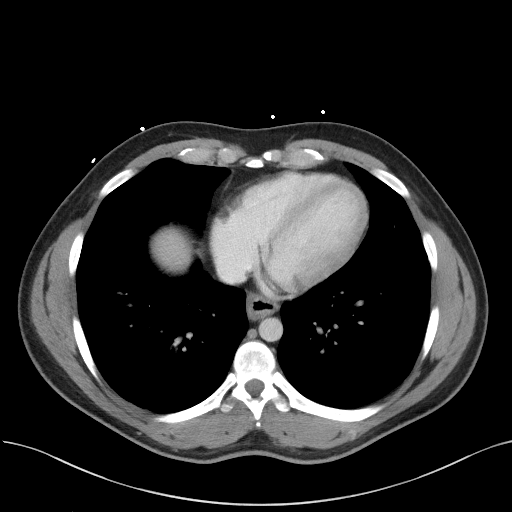
[im 55/65  soft-tissue]
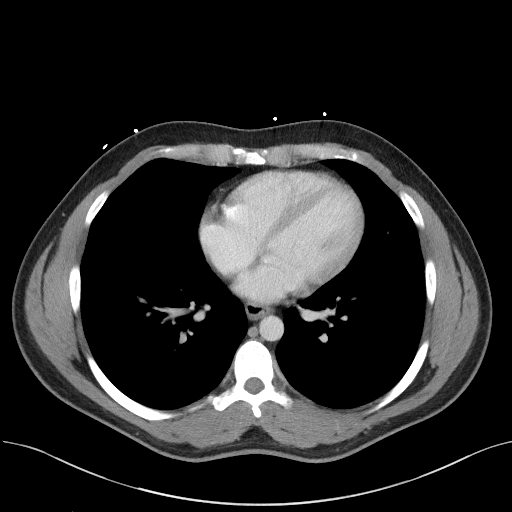
[im 61/65  soft-tissue]
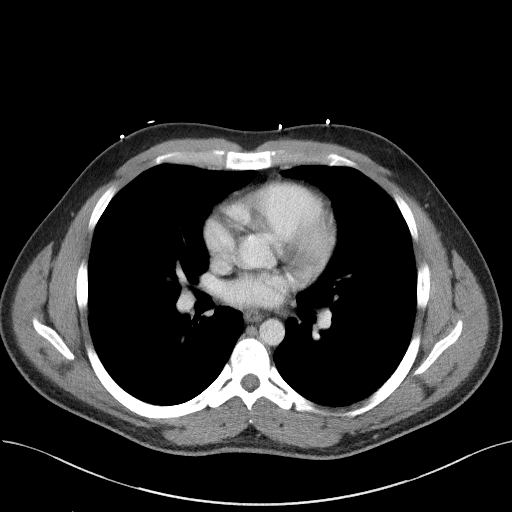

[Series 5: coronal st · coronal · 0.64mm/px · 3 of 94 slices shown]
[im 32/94  soft-tissue]
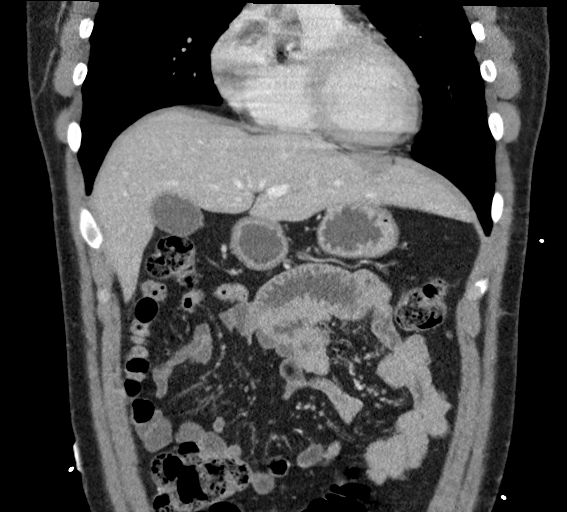
[im 42/94  soft-tissue]
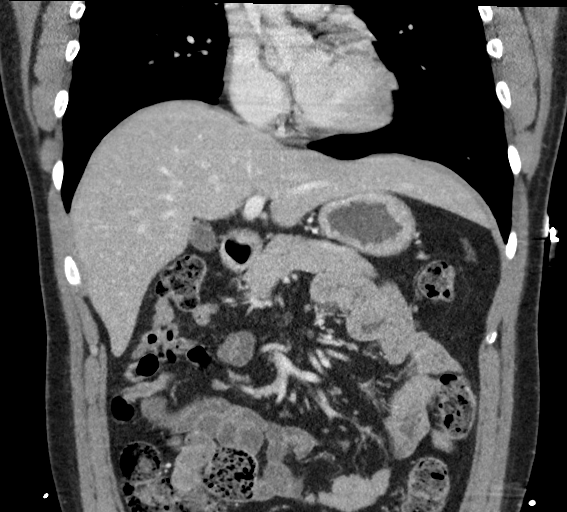
[im 52/94  soft-tissue]
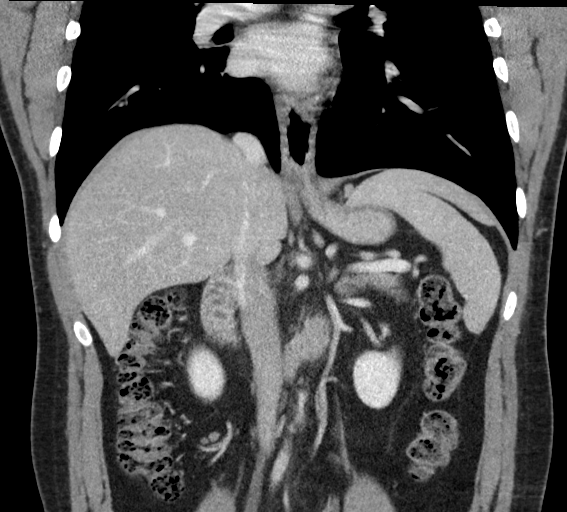

[16 of 46 positions shown; findings below may reference images not displayed]

FINDINGS: Lower chest: Unremarkable.

Hepatobiliary: No suspicious cystic or solid hepatic lesions. No
intra or extrahepatic biliary ductal dilatation. Gallbladder is
normal in appearance.

Pancreas: No pancreatic mass. No pancreatic ductal dilatation. No
pancreatic or peripancreatic fluid collections or inflammatory
changes.

Spleen: Unremarkable.

Adrenals/Urinary Tract: Bilateral kidneys and adrenal glands are
normal in appearance. No hydroureteronephrosis in the visualized
portions of the abdomen. Bilateral adrenal glands are normal in
appearance.

Stomach/Bowel: Normal appearance of the stomach. No pathologic
dilatation of visualized small bowel or colon.

Vascular/Lymphatic: No atherosclerotic calcifications, aneurysm or
dissection noted in the abdominal vascular. No lymphadenopathy noted
in the abdomen.

Other: No significant volume of ascites and no pneumoperitoneum
noted in the visualized portions of the peritoneal cavity.

Musculoskeletal: There are no aggressive appearing lytic or blastic
lesions noted in the visualized portions of the skeleton.
IMPRESSION: 1. No acute findings are noted in the abdomen. Specifically, no
imaging findings of acute pancreatitis are identified at this time.

## 2023-12-14 ENCOUNTER — Emergency Department
Admission: EM | Admit: 2023-12-14 | Discharge: 2023-12-14 | Disposition: A | Discharge status: Home / Self Care | Payer: Self-pay | Attending: Emergency Medicine | Admitting: Emergency Medicine

## 2023-12-14 DIAGNOSIS — F1123 Opioid dependence with withdrawal: Secondary | ICD-10-CM | POA: Insufficient documentation

## 2023-12-14 DIAGNOSIS — E876 Hypokalemia: Secondary | ICD-10-CM

## 2023-12-14 DIAGNOSIS — F1193 Opioid use, unspecified with withdrawal: Secondary | ICD-10-CM

## 2023-12-14 DIAGNOSIS — F1090 Alcohol use, unspecified, uncomplicated: Secondary | ICD-10-CM

## 2023-12-14 DIAGNOSIS — Y905 Blood alcohol level of 100-119 mg/100 ml: Secondary | ICD-10-CM | POA: Insufficient documentation

## 2023-12-14 LAB — COMPREHENSIVE METABOLIC PANEL WITH GFR
ALT: 29 U/L (ref 0–44)
AST: 25 U/L (ref 15–41)
Albumin: 4.9 g/dL (ref 3.5–5.0)
Alkaline Phosphatase: 68 U/L (ref 38–126)
Anion gap: 11 (ref 5–15)
BUN: 8 mg/dL (ref 6–20)
CO2: 23 mmol/L (ref 22–32)
Calcium: 9.2 mg/dL (ref 8.9–10.3)
Chloride: 105 mmol/L (ref 98–111)
Creatinine, Ser: 0.68 mg/dL (ref 0.61–1.24)
GFR, Estimated: >60 mL/min (ref >60)
Glucose, Bld: 148 mg/dL — ABNORMAL HIGH (ref 70–99)
Potassium: 3.3 mmol/L — ABNORMAL LOW (ref 3.5–5.1)
Sodium: 139 mmol/L (ref 135–145)
Total Bilirubin: 0.7 mg/dL (ref 0.0–1.2)
Total Protein: 7.9 g/dL (ref 6.5–8.1)

## 2023-12-14 LAB — CBC
HCT: 42 % (ref 39.0–52.0)
Hemoglobin: 15 g/dL (ref 13.0–17.0)
MCH: 30.1 pg (ref 26.0–34.0)
MCHC: 35.7 g/dL (ref 30.0–36.0)
MCV: 84.2 fL (ref 80.0–100.0)
Platelets: 304 10*3/uL (ref 150–400)
RBC: 4.99 MIL/uL (ref 4.22–5.81)
RDW: 11.7 % (ref 11.5–15.5)
WBC: 6.6 10*3/uL (ref 4.0–10.5)
nRBC: 0 % (ref 0.0–0.2)

## 2023-12-14 LAB — ETHANOL: Alcohol, Ethyl (B): 106 mg/dL — ABNORMAL HIGH (ref <15)

## 2023-12-14 MED ORDER — POTASSIUM CHLORIDE CRYS ER 20 MEQ PO TBCR
40.0000 meq | EXTENDED_RELEASE_TABLET | Freq: Once | ORAL | Status: AC
  Administered 2023-12-14: 40 meq via ORAL
  Filled 2023-12-14: qty 2

## 2023-12-14 MED ORDER — CLONIDINE HCL 0.1 MG/24HR TD PTWK: 0.1000 mg | MEDICATED_PATCH | TRANSDERMAL | 0 refills | Status: AC

## 2023-12-14 MED ORDER — ONDANSETRON HCL 4 MG/2ML IJ SOLN
4.0000 mg | Freq: Once | INTRAMUSCULAR | Status: AC
  Administered 2023-12-14: 4 mg via INTRAVENOUS
  Filled 2023-12-14: qty 2

## 2023-12-14 MED ORDER — ONDANSETRON 4 MG PO TBDP: 4.0000 mg | ORAL_TABLET | Freq: Three times a day (TID) | ORAL | 0 refills | Status: AC | PRN

## 2023-12-14 MED ORDER — SODIUM CHLORIDE 0.9 % IV BOLUS
1000.0000 mL | Freq: Once | INTRAVENOUS | Status: AC
  Administered 2023-12-14: 1000 mL via INTRAVENOUS

## 2023-12-14 MED ORDER — CLONIDINE HCL 0.1 MG PO TABS
0.1000 mg | ORAL_TABLET | Freq: Once | ORAL | Status: AC
  Administered 2023-12-14: 0.1 mg via ORAL
  Filled 2023-12-14: qty 1

## 2023-12-14 NOTE — ED Notes (Signed)
 Pt alert, calm, cooperative - steady, even gait.

## 2023-12-14 NOTE — ED Provider Notes (Signed)
[facility] Provider Note    Event Date/Time   First MD Initiated Contact with Patient 12/14/23 0214     (approximate)   History   Withdrawal   HPI  Theodore Ward is a 28 y.o. male  who presents to the ED from home with a chief complaint of withdrawal symptoms. Patient admits to Fentanyl use recreationally, lase use 6 days ago. Took a Percocet 2 days ago and a Suboxone (not prescribed to him) yesterday. Reports runny nose, chills, nausea, abdominal pain. Denies fever, chest pain, shortness of breath, vomiting, dysuria or diarrhea.       Past Medical History   Past Medical History:  Diagnosis Date   Anxiety    Depression      Active Problem List   Patient Active Problem List   Diagnosis Date Noted   Seizure (HCC) 01/16/2015   Cardiac arrhythmia 01/16/2015   Arrhythmia      Past Surgical History  No past surgical history on file.   Home Medications   Prior to Admission medications   Medication Sig Start Date End Date Taking? Authorizing Provider  cloNIDine (CATAPRES - DOSED IN MG/24 HR) 0.1 mg/24hr patch Place 1 patch (0.1 mg total) onto the skin every 7 (seven) days for 7 days. 12/14/23 12/21/23 Yes Norlene Beavers, MD  ondansetron  (ZOFRAN -ODT) 4 MG disintegrating tablet Take 1 tablet (4 mg total) by mouth every 8 (eight) hours as needed for nausea or vomiting. 12/14/23  Yes Norlene Beavers, MD  pantoprazole  (PROTONIX ) 40 MG tablet Take 1 tablet (40 mg total) by mouth daily. 08/29/19 01/25/20  Suellyn Emory, MD  sucralfate  (CARAFATE ) 1 g tablet Take 1 tablet (1 g total) by mouth 3 (three) times daily as needed. 08/29/19 01/25/20  Suellyn Emory, MD     Allergies  Patient has no known allergies.   Family History   Family History  Problem Relation Age of Onset   Migraines Mother    Seizures Maternal Aunt      Physical Exam  Triage Vital Signs: ED Triage Vitals  Encounter Vitals Group     BP 12/14/23 0208 138/86     Systolic BP  Percentile --      Diastolic BP Percentile --      Pulse Rate 12/14/23 0208 83     Resp 12/14/23 0208 20     Temp 12/14/23 0208 98.8 F (37.1 C)     Temp Source 12/14/23 0208 Oral     SpO2 12/14/23 0208 99 %     Weight 12/14/23 0209 160 lb (72.6 kg)     Height 12/14/23 0209 5\' 7"  (1.702 m)     Head Circumference --      Peak Flow --      Pain Score 12/14/23 0208 8     Pain Loc --      Pain Education --      Exclude from Growth Chart --     Updated Vital Signs: BP 132/87   Pulse 81   Temp 98.8 F (37.1 C) (Oral)   Resp 18   Ht 5\' 7"  (1.702 m)   Wt 72.6 kg   SpO2 98%   BMI 25.06 kg/m    General: Awake, mild distress.  CV:  RRR. Good peripheral perfusion.  Resp:  Normal effort. CTAB. Abd:  Nontender to light or deep palpation. No distention.  Other:  Runny nose. Drowsy but awakens to voice. Alert & oriented x 3. CN II-XII grossly intact.  5/5 motor strength and sensation all extremities. MAEx4.   ED Results / Procedures / Treatments  Labs (all labs ordered are listed, but only abnormal results are displayed) Labs Reviewed  COMPREHENSIVE METABOLIC PANEL WITH GFR - Abnormal; Notable for the following components:      Result Value   Potassium 3.3 (*)    Glucose, Bld 148 (*)    All other components within normal limits  ETHANOL - Abnormal; Notable for the following components:   Alcohol, Ethyl (B) 106 (*)    All other components within normal limits  CBC  URINE DRUG SCREEN, QUALITATIVE ([facility] ONLY)     EKG  None   RADIOLOGY None   Official radiology report(s): No results found.   PROCEDURES:  Critical Care performed: No  .1-3 Lead EKG Interpretation  Performed by: Norlene Beavers, MD Authorized by: Norlene Beavers, MD     Interpretation: normal     ECG rate:  80   ECG rate assessment: normal     Rhythm: sinus rhythm     Ectopy: none     Conduction: normal   Comments:     Patient placed on cardiac monitor to evaluate for arrthymias    MEDICATIONS  ORDERED IN ED: Medications  sodium chloride  0.9 % bolus 1,000 mL (0 mLs Intravenous Stopped 12/14/23 0410)  ondansetron  (ZOFRAN ) injection 4 mg (4 mg Intravenous Given 12/14/23 0252)  cloNIDine (CATAPRES) tablet 0.1 mg (0.1 mg Oral Given 12/14/23 0252)  potassium chloride  SA (KLOR-CON  M) CR tablet 40 mEq (40 mEq Oral Given 12/14/23 0404)     IMPRESSION / MDM / ASSESSMENT AND PLAN / ED COURSE  I reviewed the triage vital signs and the nursing notes.                             28 year old male presenting with opioid withdrawal symptoms. I have personally reviewed patient's records and note majority ED visits, last 04/04/2021 for fever. Will obtain labwork, initiate IV fluid hydration, Zofran  for nausea, Clonidine for withdrawal symptoms. Will reassess.   Patient's presentation is most consistent with acute complicated illness / injury requiring diagnostic workup.  The patient is on the cardiac monitor to evaluate for evidence of arrhythmia and/or significant heart rate changes.    Clinical Course as of 12/14/23 4098  Sat Dec 14, 2023  0348 Patient overall feeling better.  Will discharge home with prescriptions for Clonidine patch and ODT Zofran  to use as needed. Patient declines resources for outpatient detox.  Strict return precautions given. Patient and family member verbalize understanding and agree with plan of care. [JS]    Clinical Course User Index [JS] Norlene Beavers, MD     FINAL CLINICAL IMPRESSION(S) / ED DIAGNOSES   Final diagnoses:  Opioid withdrawal (HCC)  Hypokalemia  Alcohol use     Rx / DC Orders   ED Discharge Orders          Ordered    ondansetron  (ZOFRAN -ODT) 4 MG disintegrating tablet  Every 8 hours PRN        12/14/23 0350    cloNIDine (CATAPRES - DOSED IN MG/24 HR) 0.1 mg/24hr patch  Every 7 days        12/14/23 0350             Note:  This document was prepared using Dragon voice recognition software and may include unintentional dictation errors.    Ninfa Giannelli J, MD  12/14/23 0707  

## 2023-12-14 NOTE — Discharge Instructions (Signed)
 Place Clonidine patch x 1 week then remove. You may take Zofran  as needed for nausea. Return to the ER for worsening symptoms, persistent vomiting, difficulty breathing or other concerns.

## 2023-12-14 NOTE — ED Triage Notes (Signed)
 Pt reports he is currently withdrawing from fentanyl. Pt reports last dose of fentanyl was 5-6 days ago. Pt is currently taking suboxone. Pt reports chills and abdominal pain.

## 2024-02-12 ENCOUNTER — Encounter: Payer: Self-pay | Admitting: Emergency Medicine

## 2024-02-12 ENCOUNTER — Other Ambulatory Visit: Payer: Self-pay

## 2024-02-12 ENCOUNTER — Emergency Department
Admission: EM | Admit: 2024-02-12 | Discharge: 2024-02-12 | Disposition: A | Discharge status: Home / Self Care | Payer: Self-pay | Attending: Emergency Medicine | Admitting: Emergency Medicine

## 2024-02-12 ENCOUNTER — Emergency Department: Payer: Self-pay

## 2024-02-12 DIAGNOSIS — U071 COVID-19: Secondary | ICD-10-CM | POA: Insufficient documentation

## 2024-02-12 LAB — RESP PANEL BY RT-PCR (RSV, FLU A&B, COVID)  RVPGX2
Influenza A by PCR: NEGATIVE
Influenza B by PCR: NEGATIVE
Resp Syncytial Virus by PCR: NEGATIVE
SARS Coronavirus 2 by RT PCR: POSITIVE — AB

## 2024-02-12 LAB — GROUP A STREP BY PCR: Group A Strep by PCR: NOT DETECTED

## 2024-02-12 MED ORDER — OFLOXACIN 0.3 % OP SOLN: OPHTHALMIC | 0 refills | Status: AC

## 2024-02-12 NOTE — ED Provider Notes (Signed)
 [facility] Provider Note    Event Date/Time   First MD Initiated Contact with Patient 02/12/24 1756     (approximate)  History   Chief Complaint: Cough  HPI  Theodore Ward is a 28 y.o. male with a past medical history of anxiety presents to the emergency department for cough and lung irritation.  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  Patient states shortly after that developed a tickle in his throat and a cough.  He states he has continued to have the cough and it has not been improving so the patient came to the emergency department for evaluation.  Patient denies any chest pain.  Physical Exam   Triage Vital Signs: ED Triage Vitals  Encounter Vitals Group     BP 02/12/24 1724 (!) 139/105     Girls Systolic BP Percentile --      Girls Diastolic BP Percentile --      Boys Systolic BP Percentile --      Boys Diastolic BP Percentile --      Pulse Rate 02/12/24 1724 95     Resp 02/12/24 1724 18     Temp 02/12/24 1724 98.8 F (37.1 C)     Temp Source 02/12/24 1724 Oral     SpO2 02/12/24 1724 99 %     Weight 02/12/24 1723 165 lb (74.8 kg)     Height 02/12/24 1723 5' 8 (1.727 m)     Head Circumference --      Peak Flow --      Pain Score 02/12/24 1722 3     Pain Loc --      Pain Education --      Exclude from Growth Chart --     Most recent vital signs: Vitals:   02/12/24 1724  BP: (!) 139/105  Pulse: 95  Resp: 18  Temp: 98.8 F (37.1 C)  SpO2: 99%    General: Awake, no distress.  CV:  Good peripheral perfusion.  Regular rate and rhythm  Resp:  Normal effort.  Equal breath sounds bilaterally.  No wheeze rales or rhonchi. Abd:  No distention.  ED Results / Procedures / Treatments   MEDICATIONS ORDERED IN ED: Medications - No data to display   IMPRESSION / MDM / ASSESSMENT AND PLAN / ED COURSE  I reviewed the triage vital signs and the nursing notes.  Patient's  presentation is most consistent with acute presentation with potential threat to life or bodily function.  Patient presents to the emergency department for a cough after handling fiberglass 1 week ago.  Overall the patient appears well, no distress.  As a secondary complaint he has had some pain and itching in his left ear for the past few days.  Patient denies any congestion or fever.  Will obtain a chest x-ray and a swab as a precaution.  Patient agreeable to plan of care.  On examination patient does have mild erythema of the left auditory canal, patient uses daily Q-tips, discussed with the patient to discontinue this on a daily basis for the next week or so and we will prescribe ofloxacin  drops for the next few days.  Patient's COVID test is positive likely explaining his symptoms.  I reviewed the patient's chest x-ray no consolidation seen on my evaluation.  We will discharge with supportive care.  FINAL CLINICAL IMPRESSION(S) / ED DIAGNOSES   Cough   Note:  This document was prepared using Dragon voice recognition software and may include unintentional dictation errors.   Dorothyann Drivers, MD 02/12/24 [phone]

## 2024-02-12 NOTE — ED Notes (Signed)
 Patient believes he may have inhaled fiberglass. Patient also c/o fullness in the left ear.

## 2024-02-12 NOTE — ED Triage Notes (Signed)
 Patient to ED via POV for cough- ongoing x1 week. States he has an itch in his chest. Concerned for pneumonia. Denies fever. States sore throat.
